# Patient Record
Sex: Male | Born: 1995 | Race: White | Hispanic: No | Marital: Single | Smoking: Never smoker
Health system: Southern US, Community
[De-identification: ages and names within clinical notes are randomized; demographics above are authoritative.]

---

## 2017-04-29 ENCOUNTER — Emergency Department (HOSPITAL_COMMUNITY): Payer: 59

## 2017-04-29 ENCOUNTER — Other Ambulatory Visit: Payer: Self-pay

## 2017-04-29 ENCOUNTER — Encounter (HOSPITAL_COMMUNITY): Payer: Self-pay | Admitting: *Deleted

## 2017-04-29 ENCOUNTER — Observation Stay (HOSPITAL_COMMUNITY)
Admission: EM | Admit: 2017-04-29 | Discharge: 2017-04-30 | Disposition: A | Discharge status: Home / Self Care | Payer: 59 | Attending: Family Medicine | Admitting: Family Medicine

## 2017-04-29 DIAGNOSIS — R21 Rash and other nonspecific skin eruption: Secondary | ICD-10-CM | POA: Diagnosis present

## 2017-04-29 DIAGNOSIS — L02413 Cutaneous abscess of right upper limb: Secondary | ICD-10-CM | POA: Insufficient documentation

## 2017-04-29 DIAGNOSIS — D709 Neutropenia, unspecified: Secondary | ICD-10-CM

## 2017-04-29 DIAGNOSIS — E871 Hypo-osmolality and hyponatremia: Secondary | ICD-10-CM | POA: Insufficient documentation

## 2017-04-29 DIAGNOSIS — R05 Cough: Secondary | ICD-10-CM

## 2017-04-29 DIAGNOSIS — D61818 Other pancytopenia: Secondary | ICD-10-CM | POA: Diagnosis not present

## 2017-04-29 DIAGNOSIS — L509 Urticaria, unspecified: Secondary | ICD-10-CM | POA: Diagnosis not present

## 2017-04-29 DIAGNOSIS — Z882 Allergy status to sulfonamides status: Secondary | ICD-10-CM | POA: Diagnosis not present

## 2017-04-29 DIAGNOSIS — R059 Cough, unspecified: Secondary | ICD-10-CM

## 2017-04-29 DIAGNOSIS — J101 Influenza due to other identified influenza virus with other respiratory manifestations: Secondary | ICD-10-CM | POA: Diagnosis not present

## 2017-04-29 DIAGNOSIS — D696 Thrombocytopenia, unspecified: Secondary | ICD-10-CM

## 2017-04-29 LAB — CBC WITH DIFFERENTIAL/PLATELET
BASOS ABS: 0 10*3/uL (ref 0.0–0.1)
BASOS ABS: 0 10*3/uL (ref 0.0–0.1)
Basophils Relative: 2 %
Basophils Relative: 1 %
EOS ABS: 0 10*3/uL (ref 0.0–0.7)
Eosinophils Absolute: 0 10*3/uL (ref 0.0–0.7)
Eosinophils Relative: 2 %
Eosinophils Relative: 1 %
HCT: 42.2 % (ref 39.0–52.0)
HCT: 38.3 % — ABNORMAL LOW (ref 39.0–52.0)
Hemoglobin: 14.2 g/dL (ref 13.0–17.0)
Hemoglobin: 12.7 g/dL — ABNORMAL LOW (ref 13.0–17.0)
LYMPHS ABS: 0.4 10*3/uL — AB (ref 0.7–4.0)
Lymphocytes Relative: 24 %
Lymphocytes Relative: 15 %
Lymphs Abs: 0.3 10*3/uL — ABNORMAL LOW (ref 0.7–4.0)
MCH: 30.3 pg (ref 26.0–34.0)
MCH: 30 pg (ref 26.0–34.0)
MCHC: 33.6 g/dL (ref 30.0–36.0)
MCHC: 33.2 g/dL (ref 30.0–36.0)
MCV: 90.2 fL (ref 78.0–100.0)
MCV: 90.3 fL (ref 78.0–100.0)
MONO ABS: 0.1 10*3/uL (ref 0.1–1.0)
MONOS PCT: 17 %
Monocytes Absolute: 0.3 10*3/uL (ref 0.1–1.0)
Monocytes Relative: 6 %
Neutro Abs: 1 10*3/uL — ABNORMAL LOW (ref 1.7–7.7)
Neutro Abs: 1.3 10*3/uL — ABNORMAL LOW (ref 1.7–7.7)
Neutrophils Relative %: 55 %
Neutrophils Relative %: 77 %
PLATELETS: 95 10*3/uL — AB (ref 150–400)
Platelets: 103 10*3/uL — ABNORMAL LOW (ref 150–400)
RBC: 4.68 MIL/uL (ref 4.22–5.81)
RBC: 4.24 MIL/uL (ref 4.22–5.81)
RDW: 12.1 % (ref 11.5–15.5)
RDW: 12 % (ref 11.5–15.5)
WBC: 1.7 10*3/uL — AB (ref 4.0–10.5)
WBC: 1.7 10*3/uL — AB (ref 4.0–10.5)

## 2017-04-29 LAB — COMPREHENSIVE METABOLIC PANEL
ALT: 36 U/L (ref 17–63)
ANION GAP: 10 (ref 5–15)
AST: 44 U/L — AB (ref 15–41)
Albumin: 3.5 g/dL (ref 3.5–5.0)
Alkaline Phosphatase: 55 U/L (ref 38–126)
BILIRUBIN TOTAL: 0.6 mg/dL (ref 0.3–1.2)
BUN: 7 mg/dL (ref 6–20)
CHLORIDE: 101 mmol/L (ref 101–111)
CO2: 21 mmol/L — ABNORMAL LOW (ref 22–32)
Calcium: 8.7 mg/dL — ABNORMAL LOW (ref 8.9–10.3)
Creatinine, Ser: 0.91 mg/dL (ref 0.61–1.24)
GFR calc Af Amer: >60 mL/min (ref >60)
Glucose, Bld: 142 mg/dL — ABNORMAL HIGH (ref 65–99)
POTASSIUM: 3.7 mmol/L (ref 3.5–5.1)
Sodium: 132 mmol/L — ABNORMAL LOW (ref 135–145)
TOTAL PROTEIN: 6.7 g/dL (ref 6.5–8.1)

## 2017-04-29 LAB — BASIC METABOLIC PANEL
ANION GAP: 6 (ref 5–15)
BUN: 10 mg/dL (ref 6–20)
CO2: 26 mmol/L (ref 22–32)
Calcium: 9 mg/dL (ref 8.9–10.3)
Chloride: 99 mmol/L — ABNORMAL LOW (ref 101–111)
Creatinine, Ser: 1.16 mg/dL (ref 0.61–1.24)
Glucose, Bld: 97 mg/dL (ref 65–99)
POTASSIUM: 4.8 mmol/L (ref 3.5–5.1)
SODIUM: 131 mmol/L — AB (ref 135–145)

## 2017-04-29 LAB — MONONUCLEOSIS SCREEN: Mono Screen: NEGATIVE

## 2017-04-29 LAB — LACTATE DEHYDROGENASE: LDH: 158 U/L (ref 98–192)

## 2017-04-29 MED ORDER — FAMOTIDINE 20 MG PO TABS
20.0000 mg | ORAL_TABLET | Freq: Two times a day (BID) | ORAL | Status: DC
  Administered 2017-04-29 – 2017-04-30 (×2): 20 mg via ORAL
  Filled 2017-04-29 (×2): qty 1

## 2017-04-29 MED ORDER — SODIUM CHLORIDE 0.9 % IV BOLUS (SEPSIS)
1000.0000 mL | Freq: Once | INTRAVENOUS | Status: AC
  Administered 2017-04-29: 1000 mL via INTRAVENOUS

## 2017-04-29 MED ORDER — DIPHENHYDRAMINE HCL 50 MG/ML IJ SOLN
25.0000 mg | Freq: Once | INTRAMUSCULAR | Status: AC
  Administered 2017-04-29: 25 mg via INTRAVENOUS
  Filled 2017-04-29: qty 1

## 2017-04-29 MED ORDER — ENOXAPARIN SODIUM 40 MG/0.4ML ~~LOC~~ SOLN
40.0000 mg | SUBCUTANEOUS | Status: DC
  Filled 2017-04-29: qty 0.4

## 2017-04-29 MED ORDER — ONDANSETRON HCL 4 MG/2ML IJ SOLN
4.0000 mg | Freq: Four times a day (QID) | INTRAMUSCULAR | Status: DC | PRN
Start: 2017-04-29 — End: 2017-04-30

## 2017-04-29 MED ORDER — METHYLPREDNISOLONE SODIUM SUCC 125 MG IJ SOLR
125.0000 mg | Freq: Once | INTRAMUSCULAR | Status: AC
  Administered 2017-04-29: 125 mg via INTRAVENOUS
  Filled 2017-04-29: qty 2

## 2017-04-29 MED ORDER — ONDANSETRON HCL 4 MG PO TABS: 4.0000 mg | ORAL_TABLET | Freq: Four times a day (QID) | ORAL | Status: DC | PRN

## 2017-04-29 MED ORDER — PREDNISONE 20 MG PO TABS
40.0000 mg | ORAL_TABLET | Freq: Every day | ORAL | Status: DC
  Administered 2017-04-30: 40 mg via ORAL
  Filled 2017-04-29: qty 2

## 2017-04-29 MED ORDER — FAMOTIDINE IN NACL 20-0.9 MG/50ML-% IV SOLN
20.0000 mg | Freq: Once | INTRAVENOUS | Status: AC
  Administered 2017-04-29: 20 mg via INTRAVENOUS
  Filled 2017-04-29: qty 50

## 2017-04-29 MED ORDER — DIPHENHYDRAMINE HCL 25 MG PO CAPS
25.0000 mg | ORAL_CAPSULE | Freq: Four times a day (QID) | ORAL | Status: DC | PRN
  Administered 2017-04-29 – 2017-04-30 (×4): 25 mg via ORAL
  Filled 2017-04-29 (×4): qty 1

## 2017-04-29 MED ORDER — METHYLPREDNISOLONE SODIUM SUCC 125 MG IJ SOLR: 80.0000 mg | Freq: Once | INTRAMUSCULAR | Status: DC

## 2017-04-29 NOTE — ED Triage Notes (Signed)
Pt reports taking bactrim for abscess on his forearm. Started having redness to his face, chest and arms. Denies swelling to his throat. Pt reports that he started antibiotics last Saturday then Monday had fever and nausea. At triage, pt started getting sob and felt lightheaded. +syncopal episode at triage. Airway remains intact.

## 2017-04-29 NOTE — ED Notes (Signed)
Attempted report 

## 2017-04-29 NOTE — H&P (Deleted)
[facility] Teaching [facility] History and Physical Service Pager: [phone]  Patient name: Stephen QuestConnor Mosley Medical record number: [id number] Date of birth: 06-20-1995 Age: 22 y.o. Gender: male  Primary Care Provider: Patient, No Pcp Per Consultants: none Code Status: full  Chief Complaint: skin rash  Assessment and Plan: Stephen Mosley is a 22 y.o. male without any significant past medical history presents for new onset rash. During workup in the ED he was found to be leukopenic and thrombocytopenic.   Diffuse Maculopapular Rash Patient developed diffuse maculopapular rash that was first noticed this morning. He has never had a similar rash. Given that bactrim is well known to cause skin rash and this rash appeared in close proximity to his use of this antibiotic this seems to be the leading differential for his symptoms. Other items on the differential could be stevens-johnson's syndrome or viral exathem. SJS is unlikely in this patient as his rash is not continuous, he has no oral manifestation, and he does not have a fever in the ED. Will plan to watch overnight for obs. Will monitor for any s/s of developing SJS. It is also possible that this is a viral exanthem given hx of cough, congestion, and fever followed by appearance of rash such as ParvoB19 thought given picture most concerning for allergic reaction to medication  - admit to family medicine teaching service, Dr. Leveda AnnaHensel, med-surg, observation - vitals signs per floor routine - benadryl 25mg  q 6 hours prn itching - start prednisone 40 mg tomorrow pending patient symptoms  - pepcid 20mg  bid - zofran prn nausea - tylenol for pain - regular diet   Pancytopenia  Patient with an incidental finding of a wbc of 1.7  andd platelet count of 93, which is confirmed on recheck. Also notable for Hgb 12.5 on recheck. Upon literature review it appears that bactrim can account for all of these findings and takes up to 10  days to resolve. Other items on the differential are various viruses that can causes cytopenias such as EBV, Cytomegalovirus, or parvovirus. The last items on differential are diseases that consume platelets, such as TTP/HUS. These are very unlikely given patient's current symptomatology and lack of bleeding or bruising and initial presentation without anemia. Given his age ebv and CMV causing mono could be consistent but these are less likely given that he has no lymphadenopathy. Given acute onset  of symptoms fever very unlikely concern for leukemia, furthermore no lymphadenopathy on full body examination, no hx of chronicity of symptoms. ED consulted heme/onc whom recommend observation overnight. For workup will draw CMV IgM, haptoglobin, and LDH.  - follow up cmv igm - follow up monospot - follow up haptoglobin, LDH - monitor vitals - Obtain liver function  - am cbc, bmp - Check HIV - Consult hematology/oncology in the AM if no significant improvement   Hyponatremia: Na  132. Also a possibly consequence of bactrim. Received a bolus of fluid in the ED  - repeat BMET in the Am   Upper respiratory infection Most likely etiology of patient's reported fevers is viral illness. He has had cough, congestion for last 3-4 days. Droplet precautions. - RVP - Influenza A & B  Forearm abscess Treated with 7 days of bactrim. Denies any hx of IV drug use. Has improved significantly, looks to be healing nicely. Wound check daily. D/C bactrim. - monitor wound - no abx at this time - Will obtain UDS  PMH is insignificant  FEN/GI: regular Prophylaxis: lovenox  Disposition:  home  History of Present Illness:  Stephen Mosley is a 22 y.o. male presenting on 1/5 with a sudden onset rash. Patient was in his usual state of health until 04/21/2017 when he noticed that he had developed a skin abscess on his right forearm. He was seen at an urgent care, where the skin abscess was I&D'd and he sent home with a  10 day course of bactrim. The abscess continued to improve but the patient does endorse having fevers up to 101 in at night between 12/28 and presentation today. He reports that he woke up with a diffuse maculopapular rash in the am of 1/5. This rash is not painful but is fairly pruritic. It is diffuse. He first noticed it on his stomach, but has since spread to all areas. He has not been out in the sun "at all" the last few days. He has no sick contacts either at school or at his parents house.  Workup in the ED consisted of a cbc, bmp, cxr, ekg. Bmp significant for na 131. CBC significant for wbc 1.7 and platelets 94. CXR normal, ekg normal. A repeat cbc was obtained which showed wbc of 1.7 and platelet of 103. He received benadryl x2 and methylprednisone x1 in the ed. He reports some decreased redness and itching after receiving these treatments. Per ED report the case was discussed with hematology   Review Of Systems: Per HPI with the following additions:  ROS  Constitutional: Positive for fever. Negative for chills.  HENT: Positive for congestion.   Eyes: Negative for discharge and redness.  Respiratory: Positive for cough. Negative for sputum production.   Cardiovascular: Negative for chest pain and palpitations.  Gastrointestinal: Negative for nausea and vomiting.  Genitourinary: Negative for dysuria.  Musculoskeletal: Negative for myalgias.  Skin: Positive for itching and rash.  Neurological: Negative for headaches.   Past Medical History: History reviewed. No pertinent past medical history.  Past Surgical History: History reviewed. No pertinent surgical history.  Social History: Social History   Tobacco Use  . Smoking status: Never Smoker  Substance Use Topics  . Alcohol use: No    Frequency: Never  . Drug use: No   Lives in a dorm in [place]. No sick contacts  Family History: History reviewed. No pertinent family history.  Allergies and Medications: Allergies   Allergen Reactions  . Sulfa Antibiotics Swelling, Rash and Other (See Comments)    Sulfameth/TMP 800/160mg  - trouble breathing, fainting    Objective: BP 117/67 (BP Location: Right Arm)   Pulse 82   Temp 98.6 F (37 C) (Oral)   Resp (!) 25   SpO2 94%  Exam: General: alert, oriented x3, no acute distress, resting comfortably Eyes: eomi, perrla, no conjunctival injection ENTM: ears, nose, and head all without obvious signs of trauma. No signs of ulceration in mucosa. Neck: no lymphadenopathy, Cardiovascular: regular rate and rhythm, no m/r/g appreciated Respiratory: lungs clear to ausculation bilaterally, symmetric chest rise Gastrointestinal: soft, non-tender, non-distended MSK: 5/5 strength all 4 extremities, all muscle groups. Diffuse maculopapular rash. Derm: diffuse maculopapular rash present. Pruritis, some evidence of excoriation 2/2 pruritis. Neuro: aox3, cn 2-12 intact Psych: appropriate, very pleasant  Labs and Imaging: CBC BMET  Recent Labs  Lab 04/29/17 1556  WBC 1.7*  HGB 12.7*  HCT 38.3*  PLT 103*   Recent Labs  Lab 04/29/17 1218  NA 131*  K 4.8  CL 99*  CO2 26  BUN 10  CREATININE 1.16  GLUCOSE 97  CALCIUM 9.0  Myrene Buddy, MD 04/29/2017, 5:09 PM PGY-1, [facility] FPTS Intern pager: [phone], text pages welcome  UPPER LEVEL ADDENDUM  I have read the above note and made revisions highlighted in blue.  Noralee Chars, MD, PGY-3 [facility]

## 2017-04-29 NOTE — ED Notes (Signed)
Pt ambulatory to restroom with steady gait.

## 2017-04-29 NOTE — ED Provider Notes (Signed)
[facility] EMERGENCY DEPARTMENT Provider Note   CSN: [id number] Arrival date & time: 04/29/17  1048     History   Chief Complaint Chief Complaint  Patient presents with  . Allergic Reaction    HPI Stephen Mosley is a 22 y.o. male.  Patient is a 22 year old male with no significant past medical history who presents for possible allergic reaction.  He was treated for an abscess to his right forearm.  He has been on Bactrim for the last 7 days.  He says overall the abscess has gotten a lot better.  He states over the last 4-5 days he has had a cough which is mostly nonproductive.  He has had some runny nose and nasal congestion and some intermittent fevers up to 101.  Today he started developing a rash to his chest which spread up to his face with some facial swelling.  He denies any swelling to his lips tongue or throat.  No shortness of breath.  He became lightheaded and had a brief syncopal episode in triage.  He denies any history of allergic reactions in the past.  No oral sores.  He has not taken any medications for the symptoms today.      History reviewed. No pertinent past medical history.  There are no active problems to display for this patient.   History reviewed. No pertinent surgical history.     Home Medications    Prior to Admission medications   Medication Sig Start Date End Date Taking? Authorizing Provider  acetaminophen (TYLENOL) 325 MG tablet Take 650 mg by mouth every 6 (six) hours as needed for mild pain.   Yes [provider]  dextromethorphan (DELSYM) 30 MG/5ML liquid Take 30 mg by mouth at bedtime as needed for cough.   Yes [provider]  ibuprofen (ADVIL,MOTRIN) 200 MG tablet Take 200 mg by mouth every 6 (six) hours as needed for moderate pain.   Yes [provider]  sulfamethoxazole-trimethoprim (BACTRIM DS,SEPTRA DS) 800-160 MG tablet Take 2 tablets by mouth 2 (two) times daily. 04/22/17  Yes [provider]    Family History History reviewed. No pertinent family history.  Social History Social History   Tobacco Use  . Smoking status: Never Smoker  Substance Use Topics  . Alcohol use: No    Frequency: Never  . Drug use: No     Allergies   Sulfa antibiotics   Review of Systems Review of Systems  Constitutional: Positive for fatigue and fever. Negative for chills and diaphoresis.  HENT: Positive for congestion and rhinorrhea. Negative for sneezing.   Eyes: Negative.   Respiratory: Positive for cough. Negative for chest tightness and shortness of breath.   Cardiovascular: Negative for chest pain and leg swelling.  Gastrointestinal: Negative for abdominal pain, blood in stool, diarrhea, nausea and vomiting.  Genitourinary: Negative for difficulty urinating, flank pain, frequency and hematuria.  Musculoskeletal: Negative for arthralgias and back pain.  Skin: Positive for rash.  Neurological: Negative for dizziness, speech difficulty, weakness, numbness and headaches.     Physical Exam Updated Vital Signs BP 117/67 (BP Location: Right Arm)   Pulse 82   Temp 98.6 F (37 C) (Oral)   Resp (!) 25   SpO2 94%   Physical Exam  Constitutional: He is oriented to person, place, and time. He appears well-developed and well-nourished.  HENT:  Head: Normocephalic and atraumatic.  Mouth/Throat: Oropharynx is clear and moist. No oropharyngeal exudate.  No intraoral lesions, no angioedema  Eyes: Pupils are equal, round, and reactive to light.  Neck: Normal range of motion. Neck supple.  Cardiovascular: Normal rate, regular rhythm and normal heart sounds.  Pulmonary/Chest: Effort normal and breath sounds normal. No respiratory distress. He has no wheezes. He has no rales. He exhibits no tenderness.  Abdominal: Soft. Bowel sounds are normal. There is no tenderness. There is no rebound and no guarding.  Musculoskeletal: Normal range of motion. He exhibits no edema.    Lymphadenopathy:    He has no cervical adenopathy.  Neurological: He is alert and oriented to person, place, and time.  Skin: Skin is warm and dry. Rash (Positive blanching maculopapular rash to the chest and face.  There is no petechiae.  No purpura.  No vesicles.) noted.  Psychiatric: He has a normal mood and affect.     ED Treatments / Results  Labs (all labs ordered are listed, but only abnormal results are displayed) Labs Reviewed  BASIC METABOLIC PANEL - Abnormal; Notable for the following components:      Result Value   Sodium 131 (*)    Chloride 99 (*)    All other components within normal limits  CBC WITH DIFFERENTIAL/PLATELET - Abnormal; Notable for the following components:   WBC 1.7 (*)    Platelets 95 (*)    Neutro Abs 1.0 (*)    Lymphs Abs 0.4 (*)    All other components within normal limits  CBC WITH DIFFERENTIAL/PLATELET - Abnormal; Notable for the following components:   WBC 1.7 (*)    Hemoglobin 12.7 (*)    HCT 38.3 (*)    Platelets 103 (*)    All other components within normal limits  RESPIRATORY PANEL BY PCR  MONONUCLEOSIS SCREEN  PATHOLOGIST SMEAR REVIEW  INFLUENZA PANEL BY PCR (TYPE A & B)    EKG  EKG Interpretation  Date/Time:  Saturday April 29 2017 12:11:07 EST Ventricular Rate:  74 PR Interval:    QRS Duration: 104 QT Interval:  396 QTC Calculation: 440 R Axis:   105 Text Interpretation:  Sinus rhythm Borderline right axis deviation ST elev, probable normal early repol pattern No old tracing to compare Confirmed by Rolan BuccoBelfi, [employer] [phone]([id number]) on 04/29/2017 12:42:16 PM       Radiology Dg Chest Port 1 View  Result Date: 04/29/2017 CLINICAL DATA:  Cough EXAM: PORTABLE CHEST 1 VIEW COMPARISON:  None. FINDINGS: The heart size and mediastinal contours are within normal limits. Both lungs are clear. The visualized skeletal structures are unremarkable. IMPRESSION: No active disease. Electronically Signed   By: Elige KoHetal  Patel   On: 04/29/2017 14:13     Procedures Procedures (including critical care time)  Medications Ordered in ED Medications  methylPREDNISolone sodium succinate (SOLU-MEDROL) 125 mg/2 mL injection 125 mg (125 mg Intravenous Given 04/29/17 1300)  diphenhydrAMINE (BENADRYL) injection 25 mg (25 mg Intravenous Given 04/29/17 1300)  sodium chloride 0.9 % bolus 1,000 mL (0 mLs Intravenous Stopped 04/29/17 1400)  famotidine (PEPCID) IVPB 20 mg premix (0 mg Intravenous Stopped 04/29/17 1330)  diphenhydrAMINE (BENADRYL) injection 25 mg (25 mg Intravenous Given 04/29/17 1445)  sodium chloride 0.9 % bolus 1,000 mL (1,000 mLs Intravenous New Bag/Given 04/29/17 1445)     Initial Impression / Assessment and Plan / ED Course  I have reviewed the triage vital signs and the nursing notes.  Pertinent labs & imaging results that were available during my care of the patient were reviewed by me and considered in my medical decision making (see chart for details).  Patient is a 22 year old male who presents with a possible allergic reaction.  He has developed a rash today following a viral type syndrome over the last few days.  This started after being on Bactrim for 7 days.  He does have a marked neutropenia and thrombocytopenia.  I do not see any clinical suggestions of Stevens-Johnson's.  However given the symptoms I am not sure if this is a reaction to the Bactrim or a early Stevens-Johnson's or possibly other viral etiologies such as mono.  I did speak with Dr. Mosetta Putt with hematology who recommends overnight admission for observation.  If his WBC count continues to be low or he has any other worsening symptoms, they will consult tomorrow.  I spoke with the family medicine service who will admit the patient.  Final Clinical Impressions(s) / ED Diagnoses   Final diagnoses:  Hives  Neutropenia, unspecified type [facility])    ED Discharge Orders    None       Rolan Bucco, MD 04/29/17 [id number]

## 2017-04-29 NOTE — Progress Notes (Signed)
Pt arrived to 6n14 at this time. Denies pain/ nausea, c/o itching. Oriented to room and surroundings, parents at bedside. Family medicine notified of arrival to unit.

## 2017-04-30 DIAGNOSIS — D709 Neutropenia, unspecified: Secondary | ICD-10-CM | POA: Diagnosis not present

## 2017-04-30 DIAGNOSIS — D72819 Decreased white blood cell count, unspecified: Secondary | ICD-10-CM | POA: Diagnosis not present

## 2017-04-30 DIAGNOSIS — D696 Thrombocytopenia, unspecified: Secondary | ICD-10-CM | POA: Diagnosis not present

## 2017-04-30 DIAGNOSIS — R21 Rash and other nonspecific skin eruption: Secondary | ICD-10-CM

## 2017-04-30 DIAGNOSIS — L509 Urticaria, unspecified: Secondary | ICD-10-CM

## 2017-04-30 DIAGNOSIS — R05 Cough: Secondary | ICD-10-CM

## 2017-04-30 DIAGNOSIS — R059 Cough, unspecified: Secondary | ICD-10-CM

## 2017-04-30 LAB — RESPIRATORY PANEL BY PCR
ADENOVIRUS-RVPPCR: NOT DETECTED
Bordetella pertussis: NOT DETECTED
CORONAVIRUS 229E-RVPPCR: NOT DETECTED
CORONAVIRUS HKU1-RVPPCR: NOT DETECTED
CORONAVIRUS OC43-RVPPCR: NOT DETECTED
Chlamydophila pneumoniae: NOT DETECTED
Coronavirus NL63: NOT DETECTED
Influenza A H1 2009: DETECTED — AB
Influenza B: NOT DETECTED
METAPNEUMOVIRUS-RVPPCR: NOT DETECTED
Mycoplasma pneumoniae: NOT DETECTED
PARAINFLUENZA VIRUS 1-RVPPCR: NOT DETECTED
PARAINFLUENZA VIRUS 2-RVPPCR: NOT DETECTED
Parainfluenza Virus 3: NOT DETECTED
Parainfluenza Virus 4: NOT DETECTED
Respiratory Syncytial Virus: NOT DETECTED
Rhinovirus / Enterovirus: NOT DETECTED

## 2017-04-30 LAB — HAPTOGLOBIN: Haptoglobin: 187 mg/dL (ref 34–200)

## 2017-04-30 LAB — CBC WITH DIFFERENTIAL/PLATELET
BASOS PCT: 1 %
Basophils Absolute: 0 10*3/uL (ref 0.0–0.1)
EOS ABS: 0 10*3/uL (ref 0.0–0.7)
EOS PCT: 0 %
HCT: 40 % (ref 39.0–52.0)
HEMOGLOBIN: 13.5 g/dL (ref 13.0–17.0)
Lymphocytes Relative: 26 %
Lymphs Abs: 0.7 10*3/uL (ref 0.7–4.0)
MCH: 30.7 pg (ref 26.0–34.0)
MCHC: 33.8 g/dL (ref 30.0–36.0)
MCV: 90.9 fL (ref 78.0–100.0)
Monocytes Absolute: 0.4 10*3/uL (ref 0.1–1.0)
Monocytes Relative: 15 %
NEUTROS PCT: 58 %
Neutro Abs: 1.4 10*3/uL — ABNORMAL LOW (ref 1.7–7.7)
PLATELETS: 103 10*3/uL — AB (ref 150–400)
RBC: 4.4 MIL/uL (ref 4.22–5.81)
RDW: 12.1 % (ref 11.5–15.5)
WBC: 2.5 10*3/uL — AB (ref 4.0–10.5)

## 2017-04-30 LAB — BASIC METABOLIC PANEL
Anion gap: 7 (ref 5–15)
Anion gap: 7 (ref 5–15)
BUN: 8 mg/dL (ref 6–20)
BUN: 8 mg/dL (ref 6–20)
CHLORIDE: 97 mmol/L — AB (ref 101–111)
CHLORIDE: 102 mmol/L (ref 101–111)
CO2: 25 mmol/L (ref 22–32)
CO2: 26 mmol/L (ref 22–32)
CREATININE: 0.86 mg/dL (ref 0.61–1.24)
Calcium: 8.9 mg/dL (ref 8.9–10.3)
Calcium: 8.7 mg/dL — ABNORMAL LOW (ref 8.9–10.3)
Creatinine, Ser: 0.88 mg/dL (ref 0.61–1.24)
Glucose, Bld: 137 mg/dL — ABNORMAL HIGH (ref 65–99)
Glucose, Bld: 146 mg/dL — ABNORMAL HIGH (ref 65–99)
POTASSIUM: 4 mmol/L (ref 3.5–5.1)
POTASSIUM: 4.5 mmol/L (ref 3.5–5.1)
SODIUM: 129 mmol/L — AB (ref 135–145)
SODIUM: 135 mmol/L (ref 135–145)

## 2017-04-30 LAB — HIV ANTIBODY (ROUTINE TESTING W REFLEX): HIV SCREEN 4TH GENERATION: NONREACTIVE

## 2017-04-30 LAB — INFLUENZA PANEL BY PCR (TYPE A & B)
INFLAPCR: POSITIVE — AB
INFLBPCR: NEGATIVE

## 2017-04-30 LAB — CMV IGM: CMV IgM: <30 AU/mL (ref 0.0–29.9)

## 2017-04-30 MED ORDER — OSELTAMIVIR PHOSPHATE 75 MG PO CAPS
75.0000 mg | ORAL_CAPSULE | Freq: Two times a day (BID) | ORAL | Status: DC
  Administered 2017-04-30: 75 mg via ORAL
  Filled 2017-04-30 (×2): qty 1

## 2017-04-30 MED ORDER — DIPHENHYDRAMINE HCL 25 MG PO CAPS: 25.0000 mg | ORAL_CAPSULE | Freq: Four times a day (QID) | ORAL | 0 refills | Status: AC | PRN

## 2017-04-30 MED ORDER — SODIUM CHLORIDE 0.9 % IV SOLN: INTRAVENOUS | Status: DC

## 2017-04-30 MED ORDER — SODIUM CHLORIDE 0.9 % IV BOLUS (SEPSIS)
1000.0000 mL | Freq: Once | INTRAVENOUS | Status: AC
  Administered 2017-04-30: 1000 mL via INTRAVENOUS

## 2017-04-30 MED ORDER — PREDNISONE 20 MG PO TABS: 40.0000 mg | ORAL_TABLET | Freq: Every day | ORAL | 0 refills | Status: AC

## 2017-04-30 MED ORDER — FAMOTIDINE 20 MG PO TABS: 20.0000 mg | ORAL_TABLET | Freq: Two times a day (BID) | ORAL | 0 refills | Status: AC

## 2017-04-30 MED ORDER — OSELTAMIVIR PHOSPHATE 75 MG PO CAPS: 75.0000 mg | ORAL_CAPSULE | Freq: Two times a day (BID) | ORAL | 0 refills | Status: AC

## 2017-04-30 NOTE — Consult Note (Signed)
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  I was called to evaluate his leukopenia and thrombocytopenia.  He presented to urgent care 1 week ago for a small abscess on the right forearm, was giving Bactrim and he also used wall compression.  The abscess has nearly resolved now.  He started having fever since 5 days ago, and broke out a diffuse skin rash yesterday.  He presented to the ED yesterday for fever and skin rash.  CBC in the ED showed WBC 1.7, with absolute neutrophil 1.0, absolute lymphocytes 0.4, normal RBC, platelet count 95K, no active bleedings.  He also reports muscle achiness along with a fever.  He was found to be influenza A positive after admission.   MEDICAL HISTORY:  History reviewed. No pertinent past medical history.  SURGICAL HISTORY: History reviewed. No pertinent surgical history.  SOCIAL HISTORY: Social History   Socioeconomic History  . Marital status: Single    Spouse name: Not on file  . Number of children: Not on file  . Years of education: Not on file  . Highest education level: Not on file  Social Needs  . Financial resource strain: Not on file  . Food insecurity - worry: Not on file  . Food insecurity - inability: Not on file  . Transportation needs - medical: Not on file  . Transportation needs - non-medical: Not on file  Occupational History  . Not on file  Tobacco Use  . Smoking status: Never Smoker  Substance and Sexual Activity  . Alcohol use: No    Frequency: Never   . Drug use: No  . Sexual activity: Not on file  Other Topics Concern  . Not on file  Social History Narrative  . Not on file    FAMILY HISTORY: History reviewed. No pertinent family history.  ALLERGIES:  is allergic to sulfa antibiotics.  MEDICATIONS:  Current Facility-Administered Medications  Medication Dose Route Frequency Provider Last Rate Last Dose  . 0.9 %  sodium chloride infusion   Intravenous Continuous Mikell, Antionette Poles, MD      . diphenhydrAMINE (BENADRYL) capsule 25 mg  25 mg Oral Q6H PRN Myrene Buddy, MD   25 mg at 04/30/17 0357  . famotidine (PEPCID) tablet 20 mg  20 mg Oral BID Myrene Buddy, MD   20 mg at 04/30/17 7829  . ondansetron (ZOFRAN) tablet 4 mg  4 mg Oral Q6H PRN Myrene Buddy, MD       Or  . ondansetron [facility]) injection 4 mg  4 mg Intravenous Q6H PRN Myrene Buddy, MD      . oseltamivir (TAMIFLU) capsule 75 mg  75 mg Oral BID Lennox Solders, MD   75 mg at 04/30/17 0852  . predniSONE (DELTASONE) tablet 40 mg  40 mg Oral Q breakfast Myrene Buddy, MD   40 mg at 04/30/17 0807    REVIEW OF SYSTEMS:   Constitutional: (+) fevers, (+) malaise and fatigue  Eyes: Denies blurriness of vision, double vision or watery eyes Ears, nose, mouth, throat, and face: Denies mucositis or  sore throat Respiratory: Denies cough, dyspnea or wheezes Cardiovascular: Denies palpitation, chest discomfort or lower extremity swelling Gastrointestinal:  Denies nausea, heartburn or change in bowel habits Skin: (+) Diffuse skin rash Lymphatics: Denies new lymphadenopathy or easy bruising Neurological:Denies numbness, tingling or new weaknesses Behavioral/Psych: Mood is stable, no new changes  All other systems were reviewed with the patient and are negative.  PHYSICAL EXAMINATION: ECOG PERFORMANCE STATUS:   Vitals:   04/29/17 2321 04/30/17 0609  BP: 117/69 (!) 115/56  Pulse: 80 (!) 55  Resp: 20 20  Temp: 99.7 F (37.6 C) 98.4 F (36.9 C)  SpO2: 96%  100%   Filed Weights   04/29/17 1844  Weight: 162 lb 11.2 oz (73.8 kg)    GENERAL:alert, no distress and comfortable SKIN: (+) Diffuse maculopapular skin rash on lower extremities and trunk EYES: normal, conjunctiva are pink and non-injected, sclera clear OROPHARYNX:no exudate, no erythema and lips, buccal mucosa, and tongue normal  NECK: supple, thyroid normal size, non-tender, without nodularity LYMPH:  no palpable lymphadenopathy in the cervical, axillary or inguinal LUNGS: clear to auscultation and percussion with normal breathing effort HEART: regular rate & rhythm and no murmurs and no lower extremity edema ABDOMEN:abdomen soft, non-tender and normal bowel sounds Musculoskeletal:no cyanosis of digits and no clubbing  PSYCH: alert & oriented x 3 with fluent speech NEURO: no focal motor/sensory deficits  LABORATORY DATA:  I have reviewed the data as listed Lab Results  Component Value Date   WBC 2.5 (L) 04/30/2017   HGB 13.5 04/30/2017   HCT 40.0 04/30/2017   MCV 90.9 04/30/2017   PLT 103 (L) 04/30/2017   Recent Labs    04/29/17 1218 04/29/17 1754 04/30/17 0444  NA 131* 132* 129*  K 4.8 3.7 4.0  CL 99* 101 97*  CO2 26 21* 25  GLUCOSE 97 142* 137*  BUN 10 7 8   CREATININE 1.16 0.91 0.88  CALCIUM 9.0 8.7* 8.9  GFRNONAA >60 >60 >60  GFRAA >60 >60 >60  PROT  --  6.7  --   ALBUMIN  --  3.5  --   AST  --  44*  --   ALT  --  36  --   ALKPHOS  --  55  --   BILITOT  --  0.6  --     RADIOGRAPHIC STUDIES: I have personally reviewed the radiological images as listed and agreed with the findings in the report. Dg Chest Port 1 View  Result Date: 04/29/2017 CLINICAL DATA:  Cough EXAM: PORTABLE CHEST 1 VIEW COMPARISON:  None. FINDINGS: The heart size and mediastinal contours are within normal limits. Both lungs are clear. The visualized skeletal structures are unremarkable. IMPRESSION: No active disease. Electronically Signed   By: Elige KoHetal  Patel   On: 04/29/2017 14:13     ASSESSMENT & PLAN: 22 year old Caucasian male, without significant past medical history, presented with fever, skin rash, leukopenia and mild thrombocytopenia  1. Skin rashes likely secondary to Bactrim 2.  Neutropenia and lymphocytopenia, likely secondary to influenza, improving 3. Mild thrombocytopenia, likely secondary to Bactrim or influenza 4.  Influenza A infection 5.  Hyponatremia  Recommendations: -I have reviewed his peripheral smear, which showed mild leukopenia and thrombocytopenia, otherwise morphology is unremarkable, no abnormal cells. -I don't think he needs other hematological workup at this point, my suspicion for primary bone marrow disease is low at this point, I recfommend him to have a repeated CBC in the next two weeks, he is scheduled to see his PCP  this Friday, and he knows to repeat his CBC. If his cytopenias does not resolve in the next 2 weeks, he may need follow-up or see a hematologist in [place] where he lives. -OK to discharge home from hematology standpoint -I will be available for any questions.  I gave patient's parents my contact information.  Malachy Mood  04/30/2017    All questions were answered. The patient knows to call the clinic with any problems, questions or concerns.      Malachy Mood, MD 04/30/2017 9:39 AM

## 2017-04-30 NOTE — Progress Notes (Signed)
Family Medicine Teaching Service Daily Progress Note Intern Pager: [phone]  Patient name: Stephen Mosley Medical record number: [id number] Date of birth: 07-Dec-1995 Age: 22 y.o. Gender: male  Primary Care Provider: Patient, No Pcp Per Consultants:Hematology  Code Status: Full  Pt Overview and Major Events to Date:   Assessment and Plan: Stephen Mosley is a 22 y.o. male without any significant past medical history presents for new onset rash. During workup in the ED he was found to be leukopenic and thrombocytopenic.   Diffuse Maculopapular Rash Likely 2/2 Bactrcim Allergy  Diffuse maculopapular rash, now more confluent. No oral ulcers, sore throat, or eye pain   - vitals signs per floor routine - benadryl 25mg  q 6 hours prn itching - start prednisone 40 mg tomorrow pending patient symptoms  - pepcid 20mg  bid - zofran prn nausea - tylenol for pain - regular diet   Pancytopenia: Likely 2/2 Influenza vs.  Bactrim. Negative HIV, negative monospot. LDH negative therefore unlikely TTP or HUS  .  - Consulted hematology/oncology  Hyponatremia: Na 129 this AM. Possibly dehydration vs. SIADH due to influenza Also a possibly consequence of bactrim - Repeat bolus  - Recheck BMET @ 3 PM   Forearm abscess Healing well. Treated with 7 days of bactrim. Denies any hx of IV drug use. - monitor wound - no abx at this time  PMH is insignificant  FEN/GI: regular Prophylaxis: Ambulate patient   Disposition:Home today  Subjective:  Doing well would like to go home   Objective: Temp:  [98.4 F (36.9 C)-99.7 F (37.6 C)] 98.4 F (36.9 C) (01/06 0609) Pulse Rate:  [55-97] 55 (01/06 0609) Resp:  [18-33] 20 (01/06 0609) BP: (95-125)/(53-74) 115/56 (01/06 0609) SpO2:  [93 %-100 %] 100 % (01/06 0609) Weight:  [162 lb 11.2 oz (73.8 kg)] 162 lb 11.2 oz (73.8 kg) (01/05 1844) Physical Exam: General: well appearing, NAD  HEENT: no oral lesions Cardiovascular: RRR, no murmurs   Respiratory: CTAB Extremities: More confluent blanching macularpapular rash   Laboratory: Recent Labs  Lab 04/29/17 1218 04/29/17 1556 04/30/17 0444  WBC 1.7* 1.7* 2.5*  HGB 14.2 12.7* 13.5  HCT 42.2 38.3* 40.0  PLT 95* 103* 103*   Recent Labs  Lab 04/29/17 1218 04/29/17 1754 04/30/17 0444  NA 131* 132* 129*  K 4.8 3.7 4.0  CL 99* 101 97*  CO2 26 21* 25  BUN 10 7 8   CREATININE 1.16 0.91 0.88  CALCIUM 9.0 8.7* 8.9  PROT  --  6.7  --   BILITOT  --  0.6  --   ALKPHOS  --  55  --   ALT  --  36  --   AST  --  44*  --   GLUCOSE 97 142* 137*    Imaging/Diagnostic Tests: Dg Chest Port 1 View  Result Date: 04/29/2017 CLINICAL DATA:  Cough EXAM: PORTABLE CHEST 1 VIEW COMPARISON:  None. FINDINGS: The heart size and mediastinal contours are within normal limits. Both lungs are clear. The visualized skeletal structures are unremarkable. IMPRESSION: No active disease. Electronically Signed   By: Elige KoHetal  Patel   On: 04/29/2017 14:13    Berton BonMikell, Lenoard Helbert Zahra, MD 04/30/2017, 8:58 AM PGY-3, [facility] FPTS Intern pager: [phone], text pages welcome

## 2017-04-30 NOTE — Discharge Summary (Deleted)
Family Medicine Teaching [facility] Discharge Summary  Patient name: Stephen Mosley Medical record number: [id number] Date of birth: [birth year] Age: 22 y.o. Gender: male Date of Admission: 04/29/2017  Date of Discharge: 04/30/17  Admitting Physician: Moses Manners, MD  Primary Care Provider: Patient, No Pcp Per Consultants: Hematology   Indication for Hospitalization: Allergic Rash, Pancytopenia   Discharge Diagnoses/Problem List:   Disposition: Home   Discharge Condition: Stable   Discharge Exam:  HEENT: no oral lesions Cardiovascular: RRR, no murmurs  Respiratory: CTAB Extremities: More confluent blanching macularpapular rash   Brief Hospital Course:  Patient presenting without significant medical history presenting with fever, rash, leukopenia and mild thrombocytopenia.  She initially presented to urgent care 1 week prior with a small abscess on his right forearm was given Bactrim.  This resolved the infection in his forearm.  He started developing a fever about 5 days ago broke out in a diffuse rash 1 day prior to admission.  CBC in the ED showed WBC 1.7, with absolute neutrophil 1.0, absolute lymphocytes 0.4, normal RBC, platelet count 95K, no active bleedings.  He also reports muscle achiness along with a fever.  He was found to be influenza A positive after admission. Patient was seen by hematology who had low suspicion for primary bone marrow disease, consistent with influenza vs. possible Bactrim exposure.  Patient to follow-up with his PCP on Friday, a plan for continued monitoring of CBC and resolution of symptoms  Issues for Follow Up:  1. Repeat CBC for resolution of from thromobcytopenia and leukopenia 2. Resolution of skin rash  3. Ensure allergy listed updated - patient is allergic to bactrim   Significant Procedures: None   Significant Labs and Imaging:  Recent Labs  Lab 04/29/17 1218 04/29/17 1556 04/30/17 0444  WBC 1.7* 1.7* 2.5*  HGB 14.2 12.7*  13.5  HCT 42.2 38.3* 40.0  PLT 95* 103* 103*   Recent Labs  Lab 04/29/17 1218 04/29/17 1754 04/30/17 0444  NA 131* 132* 129*  K 4.8 3.7 4.0  CL 99* 101 97*  CO2 26 21* 25  GLUCOSE 97 142* 137*  BUN 10 7 8   CREATININE 1.16 0.91 0.88  CALCIUM 9.0 8.7* 8.9  ALKPHOS  --  55  --   AST  --  44*  --   ALT  --  36  --   ALBUMIN  --  3.5  --     Results/Tests Pending at Time of Discharge: None   Discharge Medications:  Allergies as of 04/30/2017      Reactions   Sulfa Antibiotics Swelling, Rash, Other (See Comments)   Sulfameth/TMP 800/160mg  - rash       Medication List    TAKE these medications   diphenhydrAMINE 25 mg capsule Commonly known as:  BENADRYL Take 1 capsule (25 mg total) by mouth every 6 (six) hours as needed for itching or allergies.   famotidine 20 MG tablet Commonly known as:  PEPCID Take 1 tablet (20 mg total) by mouth 2 (two) times daily.   oseltamivir 75 MG capsule Commonly known as:  TAMIFLU Take 1 capsule (75 mg total) by mouth 2 (two) times daily.   predniSONE 20 MG tablet Commonly known as:  DELTASONE Take 2 tablets (40 mg total) by mouth daily with breakfast. Start taking on:  05/01/2017       Discharge Instructions: Please refer to Patient Instructions section of EMR for full details.  Patient was counseled important signs and symptoms that should prompt  return to medical care, changes in medications, dietary instructions, activity restrictions, and follow up appointments.   Follow-Up Appointments: Follow-up Information    PCP. Go in 2 day(s).           Berton BonMikell, Darya Bigler Zahra, MD 04/30/2017, 10:57 AM PGY-3, [facility]

## 2017-04-30 NOTE — Discharge Instructions (Signed)
You were admitted for your allergic reaction to bactrim and found to have the flu. You will need to continue Tamiflu for 4 more days, for a total of 5 days. For your allergic reaction will want you to continue prednisone, pepcid, and benadryl for 3 more days. Make sure to follow up with primary care physician. The hematologist may want you to also follow up as an outpatient to ensure blood work abnormalities are resolved.

## 2017-05-01 LAB — PATHOLOGIST SMEAR REVIEW

## 2017-05-03 NOTE — Discharge Summary (Signed)
Family Medicine Teaching [facility] Discharge Summary  Patient name: Stephen Mosley Medical record number: [id number] Date of birth: 11-01-1995 Age: 22 y.o. Gender: male Date of Admission: 04/29/2017  Date of Discharge: 04/30/2017 Admitting Physician: Moses Manners, MD  Primary Care Provider: Patient, No Pcp Per Consultants: hematology  Indication for Hospitalization: leukopenia, thrombocytopenia, rash  Discharge Diagnoses/Problem List:  Diffuse Maculopapular Rash Pancytopenia Hyponatremia Forearm Abscess  Disposition: home  Discharge Condition: stable  Discharge Exam: General: well appearing, NAD  HEENT: no oral lesions Cardiovascular: RRR, no murmurs  Respiratory: CTAB Extremities: More confluent blanching macularpapular rash   Taken from progress note on 1/6 Brief Hospital Course:  22 year old who initially developed a skin abscess on his forearm. He underwent an I&D at an urgent care and received bactrim. After one week he developed a diffuse maculopapular rash that was slightly pruritic. A couple of days prior to the rash starting he did develop cough and congestion. He came into the ed on 1/5 2/2 to the rash.  He was treated with steroids and benadryl initially in the ED. On CBC he was incidentally found to have a wbc of 1.7 and plt of 95. He was admitted for observation to both monitor rash (given association of SJS with bactrim) as well as recheck cbc in am. He did not develop any findings consistent with SJS in the morning of 1/6.  His rash was felt to be consistent with bactrim hypersensitivity.  His wbc rebounded to 2.5 and plt stable at 103. Otherwise labwork only significant for Influenza A infection. Hematology was consulted on 1/6. They did not feel as though he had any primary hematological disorder to account for his leukopenia and thrombocytopenia. They felt like it was likely due to the flu. They recommended follow up in 2 weeks for a cbc. If abnormal could  possibly set him up with hematology in [place] area (where he lives). He was discharged on 1/6 in stable condition.  Issues for Follow Up:  1. Check for resolution of rash 2. Recheck cbc in 2 weeks  Significant Procedures: none  Significant Labs and Imaging:  Recent Labs  Lab 04/29/17 1218 04/29/17 1556 04/30/17 0444  WBC 1.7* 1.7* 2.5*  HGB 14.2 12.7* 13.5  HCT 42.2 38.3* 40.0  PLT 95* 103* 103*   Recent Labs  Lab 04/29/17 1218 04/29/17 1754 04/30/17 0444 04/30/17 1542  NA 131* 132* 129* 135  K 4.8 3.7 4.0 4.5  CL 99* 101 97* 102  CO2 26 21* 25 26  GLUCOSE 97 142* 137* 146*  BUN 10 7 8 8   CREATININE 1.16 0.91 0.88 0.86  CALCIUM 9.0 8.7* 8.9 8.7*  ALKPHOS  --  55  --   --   AST  --  44*  --   --   ALT  --  36  --   --   ALBUMIN  --  3.5  --   --     Results/Tests Pending at Time of Discharge:  Discharge Medications:  Allergies as of 04/30/2017      Reactions   Sulfa Antibiotics Swelling, Rash, Other (See Comments)   Sulfameth/TMP 800/160mg  - rash       Medication List    TAKE these medications   diphenhydrAMINE 25 mg capsule Commonly known as:  BENADRYL Take 1 capsule (25 mg total) by mouth every 6 (six) hours as needed for itching or allergies.   famotidine 20 MG tablet Commonly known as:  PEPCID Take 1 tablet (  20 mg total) by mouth 2 (two) times daily.   oseltamivir 75 MG capsule Commonly known as:  TAMIFLU Take 1 capsule (75 mg total) by mouth 2 (two) times daily.   predniSONE 20 MG tablet Commonly known as:  DELTASONE Take 2 tablets (40 mg total) by mouth daily with breakfast.       Discharge Instructions: Please refer to Patient Instructions section of EMR for full details.  Patient was counseled important signs and symptoms that should prompt return to medical care, changes in medications, dietary instructions, activity restrictions, and follow up appointments.   Follow-Up Appointments: Follow-up Information    PCP. Go in 2 day(s).            Myrene BuddyFletcher, Juliona Vales, MD 05/03/2017, 3:34 PM PGY-1, [facility]

## 2018-09-19 IMAGING — DX DG CHEST 1V PORT
1 series · 1 of 1 positions shown · non-contrast
Comparison: None.

CLINICAL DATA: Cough

EXAM:
PORTABLE CHEST 1 VIEW

[chest ap]
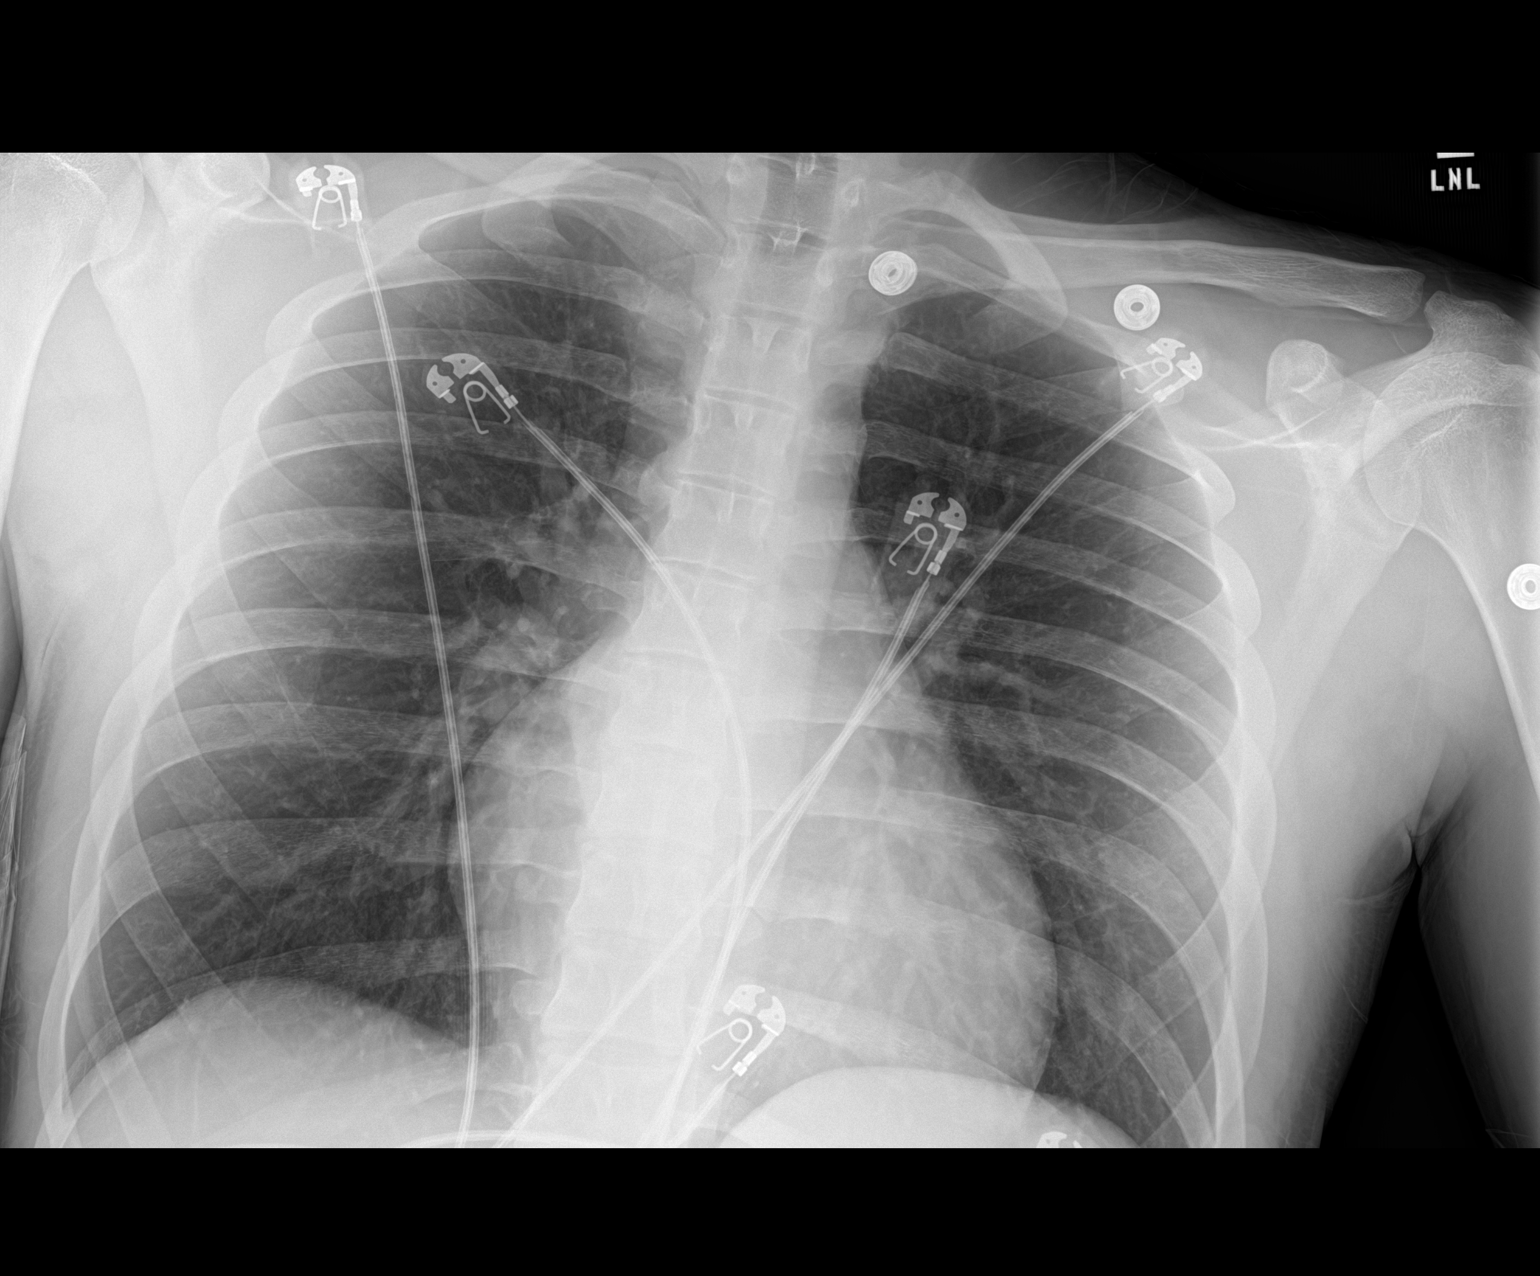

[1 of 1 positions shown; findings below may reference images not displayed]

FINDINGS: The heart size and mediastinal contours are within normal limits.
Both lungs are clear. The visualized skeletal structures are
unremarkable.
IMPRESSION: No active disease.
